# Patient Record
Sex: Female | Born: 1990 | Hispanic: No | Smoking: Current some day smoker
Health system: Southern US, Community
[De-identification: ages and names within clinical notes are randomized; demographics above are authoritative.]

## PROBLEM LIST (undated history)

## (undated) DIAGNOSIS — E039 Hypothyroidism, unspecified: Secondary | ICD-10-CM

## (undated) DIAGNOSIS — M8580 Other specified disorders of bone density and structure, unspecified site: Secondary | ICD-10-CM

## (undated) DIAGNOSIS — E2839 Other primary ovarian failure: Secondary | ICD-10-CM

## (undated) DIAGNOSIS — F419 Anxiety disorder, unspecified: Secondary | ICD-10-CM

## (undated) DIAGNOSIS — E288 Other ovarian dysfunction: Secondary | ICD-10-CM

## (undated) DIAGNOSIS — F319 Bipolar disorder, unspecified: Secondary | ICD-10-CM

## (undated) HISTORY — PX: DENTAL SURGERY: SHX609

---

## 2015-07-05 ENCOUNTER — Encounter (HOSPITAL_COMMUNITY): Payer: Self-pay

## 2015-07-05 ENCOUNTER — Emergency Department (HOSPITAL_COMMUNITY)
Admission: EM | Admit: 2015-07-05 | Discharge: 2015-07-05 | Disposition: A | Discharge status: Home / Self Care | Attending: Emergency Medicine | Admitting: Emergency Medicine

## 2015-07-05 DIAGNOSIS — Z79899 Other long term (current) drug therapy: Secondary | ICD-10-CM | POA: Insufficient documentation

## 2015-07-05 DIAGNOSIS — E039 Hypothyroidism, unspecified: Secondary | ICD-10-CM | POA: Diagnosis not present

## 2015-07-05 DIAGNOSIS — M8580 Other specified disorders of bone density and structure, unspecified site: Secondary | ICD-10-CM | POA: Insufficient documentation

## 2015-07-05 DIAGNOSIS — Z3202 Encounter for pregnancy test, result negative: Secondary | ICD-10-CM | POA: Insufficient documentation

## 2015-07-05 DIAGNOSIS — F32A Depression, unspecified: Secondary | ICD-10-CM

## 2015-07-05 DIAGNOSIS — F329 Major depressive disorder, single episode, unspecified: Secondary | ICD-10-CM | POA: Insufficient documentation

## 2015-07-05 DIAGNOSIS — N390 Urinary tract infection, site not specified: Secondary | ICD-10-CM | POA: Diagnosis not present

## 2015-07-05 DIAGNOSIS — Z87891 Personal history of nicotine dependence: Secondary | ICD-10-CM | POA: Diagnosis not present

## 2015-07-05 HISTORY — DX: Other primary ovarian failure: E28.39

## 2015-07-05 HISTORY — DX: Other specified disorders of bone density and structure, unspecified site: M85.80

## 2015-07-05 HISTORY — DX: Hypothyroidism, unspecified: E03.9

## 2015-07-05 HISTORY — DX: Other ovarian dysfunction: E28.8

## 2015-07-05 LAB — CBC
HCT: 40.8 % (ref 36.0–46.0)
Hemoglobin: 13.9 g/dL (ref 12.0–15.0)
MCH: 30.3 pg (ref 26.0–34.0)
MCHC: 34.1 g/dL (ref 30.0–36.0)
MCV: 88.9 fL (ref 78.0–100.0)
PLATELETS: 373 10*3/uL (ref 150–400)
RBC: 4.59 MIL/uL (ref 3.87–5.11)
RDW: 12.4 % (ref 11.5–15.5)
WBC: 7.9 10*3/uL (ref 4.0–10.5)

## 2015-07-05 LAB — COMPREHENSIVE METABOLIC PANEL
ALBUMIN: 4.6 g/dL (ref 3.5–5.0)
ALT: 13 U/L — ABNORMAL LOW (ref 14–54)
ANION GAP: 12 (ref 5–15)
AST: 18 U/L (ref 15–41)
Alkaline Phosphatase: 53 U/L (ref 38–126)
BILIRUBIN TOTAL: 1.1 mg/dL (ref 0.3–1.2)
BUN: 9 mg/dL (ref 6–20)
CHLORIDE: 104 mmol/L (ref 101–111)
CO2: 24 mmol/L (ref 22–32)
Calcium: 9.6 mg/dL (ref 8.9–10.3)
Creatinine, Ser: 0.81 mg/dL (ref 0.44–1.00)
GFR calc Af Amer: >60 mL/min (ref >60)
GLUCOSE: 104 mg/dL — AB (ref 65–99)
POTASSIUM: 3.6 mmol/L (ref 3.5–5.1)
Sodium: 140 mmol/L (ref 135–145)
TOTAL PROTEIN: 7.7 g/dL (ref 6.5–8.1)

## 2015-07-05 LAB — I-STAT BETA HCG BLOOD, ED (MC, WL, AP ONLY): I-stat hCG, quantitative: <5 m[IU]/mL (ref <5)

## 2015-07-05 LAB — ETHANOL

## 2015-07-05 LAB — URINALYSIS, ROUTINE W REFLEX MICROSCOPIC
Bilirubin Urine: NEGATIVE
Glucose, UA: NEGATIVE mg/dL
Ketones, ur: NEGATIVE mg/dL
Nitrite: POSITIVE — AB
Protein, ur: NEGATIVE mg/dL
Specific Gravity, Urine: 1.012 (ref 1.005–1.030)
pH: 5.5 (ref 5.0–8.0)

## 2015-07-05 LAB — URINE MICROSCOPIC-ADD ON

## 2015-07-05 LAB — RAPID URINE DRUG SCREEN, HOSP PERFORMED
AMPHETAMINES: NOT DETECTED
BENZODIAZEPINES: NOT DETECTED
Barbiturates: NOT DETECTED
COCAINE: NOT DETECTED
OPIATES: NOT DETECTED
Tetrahydrocannabinol: NOT DETECTED

## 2015-07-05 LAB — SALICYLATE LEVEL: Salicylate Lvl: <4 mg/dL (ref 2.8–30.0)

## 2015-07-05 LAB — ACETAMINOPHEN LEVEL

## 2015-07-05 MED ORDER — SULFAMETHOXAZOLE-TRIMETHOPRIM 800-160 MG PO TABS: 1.0000 | ORAL_TABLET | Freq: Two times a day (BID) | ORAL | Status: AC

## 2015-07-05 MED ORDER — SULFAMETHOXAZOLE-TRIMETHOPRIM 800-160 MG PO TABS
1.0000 | ORAL_TABLET | Freq: Two times a day (BID) | ORAL | Status: DC
  Administered 2015-07-05: 1 via ORAL
  Filled 2015-07-05: qty 1

## 2015-07-05 NOTE — ED Notes (Signed)
Dr Juleen China here to see

## 2015-07-05 NOTE — ED Notes (Signed)
Visitor in w/ pt 
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
## 2015-07-05 NOTE — ED Notes (Signed)
Discharge orders received. Pt agreeable to discharge. AVS and prescriptions provided and reviewed. Pt verbalizes understanding of discharge instructions. Pt offers no questions or concerns. Pt. Denies SI, HI, pain or discomfort. Pt. Belongings returned and signed for. Pt escorted to lobby to meet family.

## 2015-07-05 NOTE — ED Notes (Signed)
Pt c/o increasing depression x "a while," SI w/ plan to OD on ETOH or "any sort of medication that I can find" x 3 weeks ago, and dysuria x 2 days.  Pt reports recent divorce (December) and move w/o support system.  Denies HI/AV.  Denies substance abuse.  Pt reports "I haven't had my hormones for 2 weeks."  Pt reports early menopause.

## 2015-07-05 NOTE — ED Provider Notes (Signed)
CSN: [id number]     Arrival date & time 07/05/15  1134 History   First MD Initiated Contact with Patient 07/05/15 1349     Chief Complaint  Patient presents with  . Depression  . Suicidal     (Consider location/radiation/quality/duration/timing/severity/associated sxs/prior Treatment) HPI   25 year old female with increasing depression. Patient recently relocated to this area from [place]. Marital and fertility issues resulting in divorce last year. Increasingly depressed. Thoughts of "what if it all just ended" and she died. Several weeks ago thought about OD on medications and alcohol. Has had persistent feeling like this since then. Lives with sister. Previously seen a therapist when going through divorce but no established local mental health care.   Past Medical History  Diagnosis Date  . Premature ovarian failure   . Hypothyroidism   . Osteopenia    Past Surgical History  Procedure Laterality Date  . Dental surgery     History reviewed. No pertinent family history. Social History  Substance Use Topics  . Smoking status: Former Games developer  . Smokeless tobacco: None  . Alcohol Use: Yes   OB History    No data available     Review of Systems  All systems reviewed and negative, other than as noted in HPI.   Allergies  Strawberry extract  Home Medications   Prior to Admission medications   Medication Sig Start Date End Date Taking? Authorizing Provider  albuterol (PROVENTIL HFA;VENTOLIN HFA) 108 (90 Base) MCG/ACT inhaler Inhale 1-2 puffs into the lungs every 6 (six) hours as needed for wheezing or shortness of breath.   Yes Historical Provider, MD  calcium gluconate 500 MG tablet Take 1 tablet by mouth 2 (two) times daily.   Yes Historical Provider, MD  conjugated estrogens-medroxyprogesteron (PREMPHASE) TABS tablet Take 1 tablet by mouth daily.   Yes Historical Provider, MD  levothyroxine (SYNTHROID, LEVOTHROID) 50 MCG tablet Take 50 mcg by mouth daily before breakfast.    Yes Historical Provider, MD  Phenazopyridine HCl (AZO TABS PO) Take 2 tablets by mouth 2 (two) times daily.   Yes Historical Provider, MD   BP 127/90 mmHg  Pulse 72  Temp(Src) 98.2 F (36.8 C) (Oral)  Resp 17  SpO2 95%  LMP 06/30/2015 Physical Exam  Constitutional: She is oriented to person, place, and time. She appears well-developed and well-nourished. No distress.  HENT:  Head: Normocephalic and atraumatic.  Eyes: Conjunctivae are normal. Right eye exhibits no discharge. Left eye exhibits no discharge.  Neck: Neck supple.  Cardiovascular: Normal rate, regular rhythm and normal heart sounds.  Exam reveals no gallop and no friction rub.   No murmur heard. Pulmonary/Chest: Effort normal and breath sounds normal. No respiratory distress.  Abdominal: Soft. She exhibits no distension. There is no tenderness.  Musculoskeletal: She exhibits no edema or tenderness.  Neurological: She is alert and oriented to person, place, and time.  Skin: Skin is warm and dry.  Psychiatric: Her behavior is normal. Thought content normal.  Speech clear. Content appropriate. Thought process logical. Getting tearful at times when talking. Good insight.   Nursing note and vitals reviewed.   ED Course  Procedures (including critical care time) Labs Review Labs Reviewed  COMPREHENSIVE METABOLIC PANEL - Abnormal; Notable for the following:    Glucose, Bld 104 (*)    ALT 13 (*)    All other components within normal limits  ACETAMINOPHEN LEVEL - Abnormal; Notable for the following:    Acetaminophen (Tylenol), Serum <10 (*)    All  other components within normal limits  URINALYSIS, ROUTINE W REFLEX MICROSCOPIC (NOT AT [facility]) - Abnormal; Notable for the following:    Color, Urine ORANGE (*)    APPearance CLOUDY (*)    Hgb urine dipstick TRACE (*)    Nitrite POSITIVE (*)    Leukocytes, UA LARGE (*)    All other components within normal limits  URINE MICROSCOPIC-ADD ON - Abnormal; Notable for the  following:    Squamous Epithelial / LPF 0-5 (*)    Bacteria, UA FEW (*)    All other components within normal limits  URINE CULTURE  ETHANOL  SALICYLATE LEVEL  CBC  URINE RAPID DRUG SCREEN, HOSP PERFORMED  I-STAT BETA HCG BLOOD, ED ([facility], [facility], [facility] ONLY)    Imaging Review No results found. I have personally reviewed and evaluated these images and lab results as part of my medical decision-making.   EKG Interpretation None      MDM   Final diagnoses:  Depression  UTI (lower urinary tract infection)     24y female with increasing depression. Suicidal thoughts without a clear plan. Multiple recent stressors.  Did endorse UTI on ROS and UA is consistent with UTI. Abx.  Medically cleared. Needs continued abx but can appropriately be treated as an outpt for this.     Raeford Razor, MD 07/05/15 [phone]

## 2015-07-05 NOTE — ED Provider Notes (Signed)
Pt was assessed by [facility].  She is stable for close outpatient follow up.  Linwood Dibbles, MD 07/05/15 2106

## 2015-07-05 NOTE — ED Notes (Signed)
Pt has been changed into scrubs and wanded by Security.  

## 2015-07-05 NOTE — Discharge Instructions (Signed)

## 2015-07-05 NOTE — ED Notes (Signed)
Up to the bathroom 

## 2015-07-05 NOTE — ED Notes (Signed)
Pt is alert and oriented x 4. Endorses mild depression and anxiety. Pt denies SI,  HI VAH and pain. Pt continues to be calm and cooperative.

## 2015-07-07 LAB — URINE CULTURE: Culture: >=100000

## 2015-07-08 ENCOUNTER — Telehealth (HOSPITAL_BASED_OUTPATIENT_CLINIC_OR_DEPARTMENT_OTHER): Payer: Self-pay | Admitting: Emergency Medicine

## 2015-07-08 NOTE — Telephone Encounter (Signed)
Post ED Visit - Positive Culture Follow-up  Culture report reviewed by antimicrobial stewardship pharmacist:   Enzo Bi, Pharm.D.  Celedonio Miyamoto, Pharm.D., BCPS  Garvin Fila, Pharm.D.  Georgina Pillion, Pharm.D., BCPS  [place], [address].D., BCPS, AAHIVP  Estella Husk, Pharm.D., BCPS, AAHIVP  Tennis Must, Pharm.D.  Sherle Poe, [place].D.  Positive urine culture E. coli Treated with bactrim DS, organism sensitive to the same and no further patient follow-up is required at this time.  Berle Mull 07/08/2015, 11:21 AM

## 2015-11-01 ENCOUNTER — Emergency Department (HOSPITAL_COMMUNITY)
Admission: EM | Admit: 2015-11-01 | Discharge: 2015-11-02 | Disposition: A | Discharge status: Home / Self Care | Attending: Emergency Medicine | Admitting: Emergency Medicine

## 2015-11-01 ENCOUNTER — Emergency Department (HOSPITAL_COMMUNITY)

## 2015-11-01 DIAGNOSIS — Z87891 Personal history of nicotine dependence: Secondary | ICD-10-CM | POA: Insufficient documentation

## 2015-11-01 DIAGNOSIS — R05 Cough: Secondary | ICD-10-CM | POA: Diagnosis present

## 2015-11-01 DIAGNOSIS — M8580 Other specified disorders of bone density and structure, unspecified site: Secondary | ICD-10-CM | POA: Diagnosis not present

## 2015-11-01 DIAGNOSIS — E039 Hypothyroidism, unspecified: Secondary | ICD-10-CM | POA: Insufficient documentation

## 2015-11-01 DIAGNOSIS — J069 Acute upper respiratory infection, unspecified: Secondary | ICD-10-CM

## 2015-11-01 DIAGNOSIS — B9789 Other viral agents as the cause of diseases classified elsewhere: Secondary | ICD-10-CM

## 2015-11-01 DIAGNOSIS — Z79899 Other long term (current) drug therapy: Secondary | ICD-10-CM | POA: Diagnosis not present

## 2015-11-01 MED ORDER — ALBUTEROL SULFATE (2.5 MG/3ML) 0.083% IN NEBU
5.0000 mg | INHALATION_SOLUTION | Freq: Once | RESPIRATORY_TRACT | Status: AC
  Administered 2015-11-01: 5 mg via RESPIRATORY_TRACT
  Filled 2015-11-01: qty 6

## 2015-11-01 NOTE — ED Provider Notes (Signed)
CSN: [id number]     Arrival date & time 11/01/15  2133 History  By signing my name below, I, Placido SouLogan Joldersma, attest that this documentation has been prepared under the direction and in the presence of Felicie Mornavid Jovanie Verge, NP. Electronically Signed: Placido SouLogan Joldersma, ED Scribe. 11/01/2015. 11:05 PM.   Chief Complaint  Patient presents with  . Fever   Patient is a 25 y.o. female presenting with fever. The history is provided by the patient. No language interpreter was used.  Fever Max temp prior to arrival:  102.8 F Severity:  Moderate Onset quality:  Gradual Duration:  3 days Timing:  Constant Progression:  Waxing and waning Chronicity:  New Associated symptoms: congestion, cough and rhinorrhea   Associated symptoms: no nausea, no sore throat and no vomiting     HPI Comments: Darlene DuskyDesiree Simmons is a 25 y.o. female who presents to the Emergency Department complaining of waxing and waning, moderate, fever x 3 days (TMAX 102.8 F 3 days ago and 99.9 F in triage). She reports associated productive cough with yellow sputum and intermittent traces of blood as well as mild rhinorrhea, fatigue and congestion. Beginning today she began experiencing chest tightness, mild wheezing and mild SOB. She denies n/v, abd pain, changes in appetite and sore throat.   Past Medical History  Diagnosis Date  . Premature ovarian failure   . Hypothyroidism   . Osteopenia    Past Surgical History  Procedure Laterality Date  . Dental surgery     No family history on file. Social History  Substance Use Topics  . Smoking status: Former Games developermoker  . Smokeless tobacco: Not on file  . Alcohol Use: Yes   OB History    No data available     Review of Systems  Constitutional: Positive for fever and fatigue. Negative for appetite change.  HENT: Positive for congestion and rhinorrhea. Negative for sore throat.   Respiratory: Positive for cough, chest tightness, shortness of breath and wheezing.   Gastrointestinal: Negative  for nausea, vomiting and abdominal pain.  All other systems reviewed and are negative.   Allergies  Strawberry extract  Home Medications   Prior to Admission medications   Medication Sig Start Date End Date Taking? Authorizing Provider  albuterol (PROVENTIL HFA;VENTOLIN HFA) 108 (90 Base) MCG/ACT inhaler Inhale 1-2 puffs into the lungs every 6 (six) hours as needed for wheezing or shortness of breath.   Yes Historical Provider, MD  calcium gluconate 500 MG tablet Take 1 tablet by mouth 2 (two) times daily.   Yes Historical Provider, MD  conjugated estrogens-medroxyprogesteron (PREMPHASE) TABS tablet Take 1 tablet by mouth daily.   Yes Historical Provider, MD  levothyroxine (SYNTHROID, LEVOTHROID) 50 MCG tablet Take 50 mcg by mouth daily before breakfast.   Yes Historical Provider, MD  Phenazopyridine HCl (AZO TABS PO) Take 2 tablets by mouth 2 (two) times daily.   Yes Historical Provider, MD   BP 136/90 mmHg  Pulse 107  Temp(Src) 99.9 F (37.7 C) (Oral)  Resp 20  Ht 5' (1.524 m)  Wt 128 lb (58.06 kg)  BMI 25.00 kg/m2  SpO2 98%  LMP 10/30/2015    Physical Exam  Constitutional: She is oriented to person, place, and time. She appears well-developed and well-nourished.  HENT:  Head: Normocephalic and atraumatic.  Mouth/Throat: Uvula is midline. Posterior oropharyngeal erythema present.  Eyes: EOM are normal.  Neck: Normal range of motion.  Cardiovascular: Normal rate, regular rhythm and normal heart sounds.  Exam reveals no gallop and  no friction rub.   No murmur heard. Pulmonary/Chest: Effort normal and breath sounds normal. No respiratory distress. She has no wheezes. She has no rales.  Abdominal: Soft. There is no tenderness.  Musculoskeletal: Normal range of motion.  Neurological: She is alert and oriented to person, place, and time.  Skin: Skin is warm and dry.  Psychiatric: She has a normal mood and affect.  Nursing note and vitals reviewed.   ED Course  Procedures   DIAGNOSTIC STUDIES: Oxygen Saturation is 98% on RA, normal by my interpretation.    COORDINATION OF CARE: 11:00 PM Discussed next steps with pt. Pt verbalized understanding and is agreeable with the plan.   Labs Review Labs Reviewed  CBC WITH DIFFERENTIAL/PLATELET - Abnormal; Notable for the following:    WBC 12.0 (*)    Neutro Abs 9.1 (*)    All other components within normal limits    Imaging Review Dg Chest 2 View  11/01/2015  CLINICAL DATA:  Fever and shortness of breath for 3 days, initial encounter EXAM: CHEST  2 VIEW COMPARISON:  None. FINDINGS: The heart size and mediastinal contours are within normal limits. Both lungs are clear. The visualized skeletal structures are unremarkable. IMPRESSION: No active cardiopulmonary disease. Electronically Signed   By: Alcide Clever M.D.   On: 11/01/2015 22:20   I have personally reviewed and evaluated these images and lab results as part of my medical decision-making.   EKG Interpretation None      MDM   Final diagnoses:  None  Pt symptoms consistent with URI. CXR negative for acute infiltrate. Pt will be discharged with symptomatic treatment.  Discussed return precautions.  Pt is hemodynamically stable & in NAD prior to discharge.  I personally performed the services described in this documentation, which was scribed in my presence. The recorded information has been reviewed and is accurate.    Felicie Morn, NP 11/02/15 1610  Doug Sou, MD 11/02/15 [id number]

## 2015-11-01 NOTE — ED Notes (Signed)
Pt reports she has had a fever that started on Monday and the highest was 102.8 on Tuesday. At triage her temp is 99.9 and has not taken any meds tonight. She has a productive cough of dark yellow phlegm and her voice sounds like she is congested.

## 2015-11-02 LAB — CBC WITH DIFFERENTIAL/PLATELET
BASOS ABS: 0 10*3/uL (ref 0.0–0.1)
BASOS PCT: 0 %
EOS ABS: 0.2 10*3/uL (ref 0.0–0.7)
EOS PCT: 2 %
HCT: 36.3 % (ref 36.0–46.0)
Hemoglobin: 12.1 g/dL (ref 12.0–15.0)
LYMPHS PCT: 16 %
Lymphs Abs: 1.9 10*3/uL (ref 0.7–4.0)
MCH: 29.4 pg (ref 26.0–34.0)
MCHC: 33.3 g/dL (ref 30.0–36.0)
MCV: 88.1 fL (ref 78.0–100.0)
MONO ABS: 0.8 10*3/uL (ref 0.1–1.0)
Monocytes Relative: 7 %
Neutro Abs: 9.1 10*3/uL — ABNORMAL HIGH (ref 1.7–7.7)
Neutrophils Relative %: 75 %
PLATELETS: 282 10*3/uL (ref 150–400)
RBC: 4.12 MIL/uL (ref 3.87–5.11)
RDW: 12.2 % (ref 11.5–15.5)
WBC: 12 10*3/uL — AB (ref 4.0–10.5)

## 2015-11-02 MED ORDER — BENZONATATE 100 MG PO CAPS: 100.0000 mg | ORAL_CAPSULE | Freq: Three times a day (TID) | ORAL | Status: AC

## 2015-11-02 NOTE — Discharge Instructions (Signed)

## 2015-12-09 ENCOUNTER — Emergency Department (HOSPITAL_COMMUNITY)
Admission: EM | Admit: 2015-12-09 | Discharge: 2015-12-09 | Disposition: A | Discharge status: Home / Self Care | Attending: Emergency Medicine | Admitting: Emergency Medicine

## 2015-12-09 ENCOUNTER — Emergency Department (HOSPITAL_COMMUNITY)

## 2015-12-09 ENCOUNTER — Encounter (HOSPITAL_COMMUNITY): Payer: Self-pay

## 2015-12-09 DIAGNOSIS — E039 Hypothyroidism, unspecified: Secondary | ICD-10-CM | POA: Diagnosis not present

## 2015-12-09 DIAGNOSIS — Z87891 Personal history of nicotine dependence: Secondary | ICD-10-CM | POA: Insufficient documentation

## 2015-12-09 DIAGNOSIS — R0781 Pleurodynia: Secondary | ICD-10-CM | POA: Diagnosis present

## 2015-12-09 MED ORDER — KETOROLAC TROMETHAMINE 60 MG/2ML IM SOLN
60.0000 mg | Freq: Once | INTRAMUSCULAR | Status: AC
  Administered 2015-12-09: 60 mg via INTRAMUSCULAR
  Filled 2015-12-09: qty 2

## 2015-12-09 MED ORDER — OXYCODONE-ACETAMINOPHEN 5-325 MG PO TABS
1.0000 | ORAL_TABLET | Freq: Once | ORAL | Status: AC
  Administered 2015-12-09: 1 via ORAL
  Filled 2015-12-09: qty 1

## 2015-12-09 MED ORDER — NAPROXEN 500 MG PO TABS: 500.0000 mg | ORAL_TABLET | Freq: Two times a day (BID) | ORAL | Status: AC

## 2015-12-09 MED ORDER — LIDOCAINE 5 % EX PTCH: 1.0000 | MEDICATED_PATCH | CUTANEOUS | Status: AC

## 2015-12-09 MED ORDER — OXYCODONE-ACETAMINOPHEN 5-325 MG PO TABS: 1.0000 | ORAL_TABLET | Freq: Four times a day (QID) | ORAL | Status: AC | PRN

## 2015-12-09 MED ORDER — METHOCARBAMOL 500 MG PO TABS: 500.0000 mg | ORAL_TABLET | Freq: Two times a day (BID) | ORAL | Status: AC
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
## 2015-12-09 NOTE — ED Provider Notes (Signed)
CSN: [id number]     Arrival date & time 12/09/15  1820 History  By signing my name below, I, Rosario AdieWilliam Andrew Hiatt, attest that this documentation has been prepared under the direction and in the presence of Jakyle Petrucelli, PA-C.  Electronically Signed: Rosario AdieWilliam Andrew Hiatt, ED Scribe. 12/09/2015. 7:19 PM.   Chief Complaint  Patient presents with  . Ribcage pain    The history is provided by the patient. No language interpreter was used.    HPI Comments: Darlene DuskyDesiree Renn is a 25 y.o. female with a PMHx of osteopenia and Hypothyroidism who presents to the Emergency Department complaining of sudden onset, gradually worsening, constant, 10/10 right-sided ribcage pain s/p being tackled 4 days PTA. She has associated upper, right sided back pain. Per pt, she was playing football at her work when she was tackled landing on her back on the grassy field. She states that when she fell onto her back her pain was initially very mild, but has gradually worsened over the past few days. She also notes that two days ago she was laying in bed, turned abruptly on her side, and at that time she heard a "crack," accompanied with a significant amount of pain. Her pain is exacerbated with movement and deep breaths. No noted OTC medications or home remedies tried PTA. She denies abdominal pain, shortness of breath, N/V, fever/chills, or any other symptoms.     Past Medical History  Diagnosis Date  . Premature ovarian failure   . Hypothyroidism   . Osteopenia    Past Surgical History  Procedure Laterality Date  . Dental surgery     History reviewed. No pertinent family history. Social History  Substance Use Topics  . Smoking status: Former Games developermoker  . Smokeless tobacco: None  . Alcohol Use: Yes   OB History    No data available     Review of Systems  Constitutional: Negative for fever and chills.  Respiratory: Negative for cough and shortness of breath.   Gastrointestinal: Negative for nausea, vomiting and  abdominal pain.  Musculoskeletal: Positive for myalgias (ribcage) and back pain (upper right).  All other systems reviewed and are negative.  Allergies  Strawberry extract  Home Medications   Prior to Admission medications   Medication Sig Start Date End Date Taking? Authorizing Provider  albuterol (PROVENTIL HFA;VENTOLIN HFA) 108 (90 Base) MCG/ACT inhaler Inhale 1-2 puffs into the lungs every 6 (six) hours as needed for wheezing or shortness of breath.    Historical Provider, MD  benzonatate (TESSALON) 100 MG capsule Take 1 capsule (100 mg total) by mouth every 8 (eight) hours. 11/02/15   Felicie Mornavid Smith, NP  calcium gluconate 500 MG tablet Take 1 tablet by mouth 2 (two) times daily.    Historical Provider, MD  conjugated estrogens-medroxyprogesteron (PREMPHASE) TABS tablet Take 1 tablet by mouth daily.    Historical Provider, MD  levothyroxine (SYNTHROID, LEVOTHROID) 50 MCG tablet Take 50 mcg by mouth daily before breakfast.    Historical Provider, MD  lidocaine (LIDODERM) 5 % Place 1 patch onto the skin daily. Remove & Discard patch within 12 hours or as directed by MD 12/09/15   Anselm PancoastShawn C Braidon Chermak, PA-C  methocarbamol (ROBAXIN) 500 MG tablet Take 1 tablet (500 mg total) by mouth 2 (two) times daily. 12/09/15   Charli Liberatore C Mary Hockey, PA-C  naproxen (NAPROSYN) 500 MG tablet Take 1 tablet (500 mg total) by mouth 2 (two) times daily. 12/09/15   Gyneth Hubka C Tayonna Bacha, PA-C  oxyCODONE-acetaminophen (PERCOCET/ROXICET) 5-325 MG tablet  Take 1 tablet by mouth every 6 (six) hours as needed for severe pain. 12/09/15   Gaius Ishaq C Mac Dowdell, PA-C  Phenazopyridine HCl (AZO TABS PO) Take 2 tablets by mouth 2 (two) times daily.    Historical Provider, MD   BP 137/89 mmHg  Pulse 83  Temp(Src) 98 F (36.7 C) (Oral)  Resp 17  SpO2 98%  LMP 12/07/2015   Physical Exam  Constitutional: She is oriented to person, place, and time. She appears well-developed and well-nourished. No distress.  HENT:  Head: Normocephalic and atraumatic.  Eyes:  Conjunctivae are normal. Pupils are equal, round, and reactive to light.  Neck: Normal range of motion. Neck supple.  Cardiovascular: Normal rate, regular rhythm and intact distal pulses.   Pulmonary/Chest: Effort normal and breath sounds normal. No respiratory distress.  Abdominal: Soft. She exhibits no distension. There is no tenderness. There is no guarding.  Musculoskeletal: Normal range of motion. She exhibits tenderness. She exhibits no edema.  TTP to the anterior ribs 2, 3, and 6-8. No instability, swelling or crepitus noted. TTP over the right scapula.  Full ROM in all extremities and spine. No paraspinal tenderness.   Neurological: She is alert and oriented to person, place, and time.  No sensory deficits. Strength 5/5 in all extremities. No gait disturbance. Coordination intact.   Skin: Skin is warm and dry. She is not diaphoretic.  Psychiatric: She has a normal mood and affect. Her behavior is normal.  Nursing note and vitals reviewed.  ED Course  Procedures (including critical care time)  DIAGNOSTIC STUDIES: Oxygen Saturation is 98% on RA, normal by my interpretation.   COORDINATION OF CARE: 7:19 PM-Discussed next steps with pt including DG right scapula, DG T-spine, and DG unilateral ribs. Pt verbalized understanding and is agreeable with the plan.   Imaging Review Dg Ribs Unilateral W/chest Right  12/09/2015  CLINICAL DATA:  Pain after trauma. EXAM: RIGHT RIBS AND CHEST - 3+ VIEW COMPARISON:  Chest x-ray November 01, 2015 FINDINGS: The heart, hila, mediastinum, lungs, and pleura are normal. No pneumothorax. IMPRESSION: Negative. Electronically Signed   By: Gerome Sam III M.D   On: 12/09/2015 20:14   Dg Thoracic Spine 2 View  12/09/2015  CLINICAL DATA:  Pain after trauma EXAM: THORACIC SPINE 2 VIEWS COMPARISON:  None. FINDINGS: There is no evidence of thoracic spine fracture. Alignment is normal. No other significant bone abnormalities are identified. IMPRESSION: Negative.  Electronically Signed   By: Gerome Sam III M.D   On: 12/09/2015 20:16   Dg Scapula Right  12/09/2015  CLINICAL DATA:  Pain after trauma EXAM: RIGHT SCAPULA - 2+ VIEWS COMPARISON:  None. FINDINGS: There is no evidence of fracture or other focal bone lesions. Soft tissues are unremarkable. IMPRESSION: Negative. Electronically Signed   By: Gerome Sam III M.D   On: 12/09/2015 20:15    I have personally reviewed and evaluated these images as part of my medical decision-making.  MDM   Final diagnoses:  Rib pain on right side    Oneita Allmon presents with right rib and right upper back pain following an injury 4 days ago.  No abnormalities on x-ray. Supportive care indicated. Patient should be cleared by a physician prior to returning to work as her job is rather strenuous and her pain hinders her from lifting heavy objects. The patient was given instructions for home care as well as return precautions. Patient voices understanding of these instructions, accepts the plan, and is comfortable with discharge.  I personally performed the services described in this documentation, which was scribed in my presence. The recorded information has been reviewed and is accurate.   Anselm Pancoast, PA-C 12/09/15 2049  Tilden Fossa, MD 12/10/15 1450

## 2015-12-09 NOTE — Discharge Instructions (Signed)
You have been seen today for rib and back pain. Your imaging showed no abnormalities. Take it easy, but do not lay around too much as this may make the stiffness worse. Take 500 mg of naproxen every 12 hours or 800 mg of ibuprofen every 8 hours for the next 3 days. Take these medications with food to avoid upset stomach. Robaxin is a muscle relaxer and may help loosen stiff muscles. Percocet for severe pain. Do not take the Robaxin or Percocet while driving or performing other dangerous activities. Follow up with PCP as needed should symptoms fail to resolve. Return to ED should symptoms worsen.

## 2015-12-09 NOTE — ED Notes (Signed)
PT DISCHARGED. INSTRUCTIONS AND PRESCRIPTIONS GIVEN. AAOX4. PT IN NO APPARENT DISTRESS. THE OPPORTUNITY TO ASK QUESTIONS WAS PROVIDED. 

## 2015-12-09 NOTE — ED Notes (Signed)
Patient transported to X-ray 

## 2016-03-06 ENCOUNTER — Other Ambulatory Visit: Payer: Self-pay | Admitting: Nurse Practitioner

## 2016-03-06 ENCOUNTER — Ambulatory Visit
Admission: RE | Admit: 2016-03-06 | Discharge: 2016-03-06 | Disposition: A | Discharge status: Home / Self Care | Source: Ambulatory Visit | Attending: Nurse Practitioner | Admitting: Nurse Practitioner

## 2016-03-06 DIAGNOSIS — M79672 Pain in left foot: Secondary | ICD-10-CM

## 2016-08-29 ENCOUNTER — Encounter (HOSPITAL_COMMUNITY): Payer: Self-pay | Admitting: Emergency Medicine

## 2016-08-29 ENCOUNTER — Emergency Department (HOSPITAL_COMMUNITY)
Admission: EM | Admit: 2016-08-29 | Discharge: 2016-08-29 | Disposition: A | Discharge status: Home / Self Care | Attending: Emergency Medicine | Admitting: Emergency Medicine

## 2016-08-29 DIAGNOSIS — F17210 Nicotine dependence, cigarettes, uncomplicated: Secondary | ICD-10-CM | POA: Diagnosis not present

## 2016-08-29 DIAGNOSIS — N390 Urinary tract infection, site not specified: Secondary | ICD-10-CM | POA: Diagnosis not present

## 2016-08-29 DIAGNOSIS — E039 Hypothyroidism, unspecified: Secondary | ICD-10-CM | POA: Diagnosis not present

## 2016-08-29 DIAGNOSIS — Z79899 Other long term (current) drug therapy: Secondary | ICD-10-CM | POA: Insufficient documentation

## 2016-08-29 DIAGNOSIS — R300 Dysuria: Secondary | ICD-10-CM | POA: Diagnosis present

## 2016-08-29 LAB — URINALYSIS, ROUTINE W REFLEX MICROSCOPIC
Bilirubin Urine: NEGATIVE
GLUCOSE, UA: NEGATIVE mg/dL
KETONES UR: NEGATIVE mg/dL
NITRITE: NEGATIVE
PH: 6 (ref 5.0–8.0)
Protein, ur: 30 mg/dL — AB
Specific Gravity, Urine: 1.023 (ref 1.005–1.030)

## 2016-08-29 LAB — POC URINE PREG, ED: Preg Test, Ur: NEGATIVE

## 2016-08-29 MED ORDER — IBUPROFEN 800 MG PO TABS
800.0000 mg | ORAL_TABLET | Freq: Once | ORAL | Status: AC
  Administered 2016-08-29: 800 mg via ORAL
  Filled 2016-08-29: qty 1

## 2016-08-29 MED ORDER — CEPHALEXIN 500 MG PO CAPS
500.0000 mg | ORAL_CAPSULE | Freq: Three times a day (TID) | ORAL | 0 refills | Status: AC
Start: 2016-08-29 — End: 2016-09-08

## 2016-08-29 NOTE — ED Triage Notes (Signed)
Patient reports urinary frequency and dysuria starting last night. Patient also reports pelvic pain starting this morning. Hx of cyst rupturing.

## 2016-08-29 NOTE — ED Provider Notes (Signed)
[facility]-EMERGENCY DEPT Provider Note   CSN: [id number] Arrival date & time: 08/29/16  0801     History   Chief Complaint Chief Complaint  Patient presents with  . Dysuria  . Pelvic Pain    HPI Darlene Simmons is a 26 y.o. female.  The history is provided by the patient.  Dysuria   This is a new problem. The current episode started yesterday. The problem occurs every urination. The problem has not changed since onset.The quality of the pain is described as burning. The pain is moderate. There has been no fever. She is sexually active (new partner no protection). Associated symptoms include frequency. Pertinent negatives include no vomiting, no hematuria and no flank pain. She has tried nothing for the symptoms. Her past medical history is significant for recurrent UTIs.    Past Medical History:  Diagnosis Date  . Hypothyroidism   . Osteopenia   . Premature ovarian failure     There are no active problems to display for this patient.   Past Surgical History:  Procedure Laterality Date  . DENTAL SURGERY      OB History    No data available       Home Medications    Prior to Admission medications   Medication Sig Start Date End Date Taking? Authorizing Provider  albuterol (PROVENTIL HFA;VENTOLIN HFA) 108 (90 Base) MCG/ACT inhaler Inhale 1-2 puffs into the lungs every 6 (six) hours as needed for wheezing or shortness of breath.    Historical Provider, MD  benzonatate (TESSALON) 100 MG capsule Take 1 capsule (100 mg total) by mouth every 8 (eight) hours. 11/02/15   Felicie Morn, NP  calcium gluconate 500 MG tablet Take 1 tablet by mouth 2 (two) times daily.    Historical Provider, MD  conjugated estrogens-medroxyprogesteron (PREMPHASE) TABS tablet Take 1 tablet by mouth daily.    Historical Provider, MD  levothyroxine (SYNTHROID, LEVOTHROID) 50 MCG tablet Take 50 mcg by mouth daily before breakfast.    Historical Provider, MD  lidocaine (LIDODERM) 5 % Place 1 patch onto the  skin daily. Remove & Discard patch within 12 hours or as directed by MD 12/09/15   Anselm Pancoast, PA-C  methocarbamol (ROBAXIN) 500 MG tablet Take 1 tablet (500 mg total) by mouth 2 (two) times daily. 12/09/15   Shawn C Joy, PA-C  naproxen (NAPROSYN) 500 MG tablet Take 1 tablet (500 mg total) by mouth 2 (two) times daily. 12/09/15   Shawn C Joy, PA-C  oxyCODONE-acetaminophen (PERCOCET/ROXICET) 5-325 MG tablet Take 1 tablet by mouth every 6 (six) hours as needed for severe pain. 12/09/15   Shawn C Joy, PA-C  Phenazopyridine HCl (AZO TABS PO) Take 2 tablets by mouth 2 (two) times daily.    Historical Provider, MD    Family History No family history on file.  Social History Social History  Substance Use Topics  . Smoking status: Current Some Day Smoker    Types: Cigarettes  . Smokeless tobacco: Never Used  . Alcohol use Yes     Allergies   Strawberry extract   Review of Systems Review of Systems  Gastrointestinal: Negative for vomiting.  Genitourinary: Positive for dysuria and frequency. Negative for flank pain and hematuria.  All other systems reviewed and are negative.    Physical Exam Updated Vital Signs BP (!) 129/100 (BP Location: Right Arm)   Pulse 85   Temp 97.9 F (36.6 C) (Oral)   Resp 18   Ht 5' (1.524 m)   Wt  138 lb (62.6 kg)   LMP 07/23/2016   SpO2 97%   BMI 26.95 kg/m   Physical Exam  Constitutional: She is oriented to person, place, and time. She appears well-developed and well-nourished. No distress.  HENT:  Head: Normocephalic.  Nose: Nose normal.  Eyes: Conjunctivae are normal.  Neck: Neck supple. No tracheal deviation present.  Cardiovascular: Normal rate and regular rhythm.   Pulmonary/Chest: Effort normal. No respiratory distress.  Abdominal: Soft. She exhibits no distension. There is tenderness (mild suprapubic). There is no rebound and no guarding.  Neurological: She is alert and oriented to person, place, and time.  Skin: Skin is warm and dry.    Psychiatric: She has a normal mood and affect.     ED Treatments / Results  Labs (all labs ordered are listed, but only abnormal results are displayed) Labs Reviewed  URINALYSIS, ROUTINE W REFLEX MICROSCOPIC  POC URINE PREG, ED    EKG  EKG Interpretation None       Radiology No results found.  Procedures Procedures (including critical care time)  Medications Ordered in ED Medications - No data to display   Initial Impression / Assessment and Plan / ED Course  I have reviewed the triage vital signs and the nursing notes.  Pertinent labs & imaging results that were available during my care of the patient were reviewed by me and considered in my medical decision making (see chart for details).     26 year old female presents with typical urinary tract infection symptoms of dysuria and frequency starting yesterday but worsening this morning now having some suprapubic pain during her workout. She recently became sexually active with a new partner and does not use protection. No discharge, has scant vaginal bleeding. Given her typical symptoms will treat empirically with Keflex pending culture. Discussed need for STI check the symptoms do not improve or she develops vaginal discharge.  Final Clinical Impressions(s) / ED Diagnoses   Final diagnoses:  Lower urinary tract infectious disease    New Prescriptions New Prescriptions   CEPHALEXIN (KEFLEX) 500 MG CAPSULE    Take 1 capsule (500 mg total) by mouth 3 (three) times daily.     Lyndal Pulley, MD 08/29/16 [phone]

## 2016-08-30 LAB — URINE CULTURE: Culture: NO GROWTH

## 2017-06-25 IMAGING — CR DG FOOT 2V*L*
2 series · 2 of 2 positions shown · non-contrast
Comparison: None.

CLINICAL DATA: Left foot pain for 6 weeks.

EXAM:
LEFT FOOT - 2 VIEW

[x foot ap left]
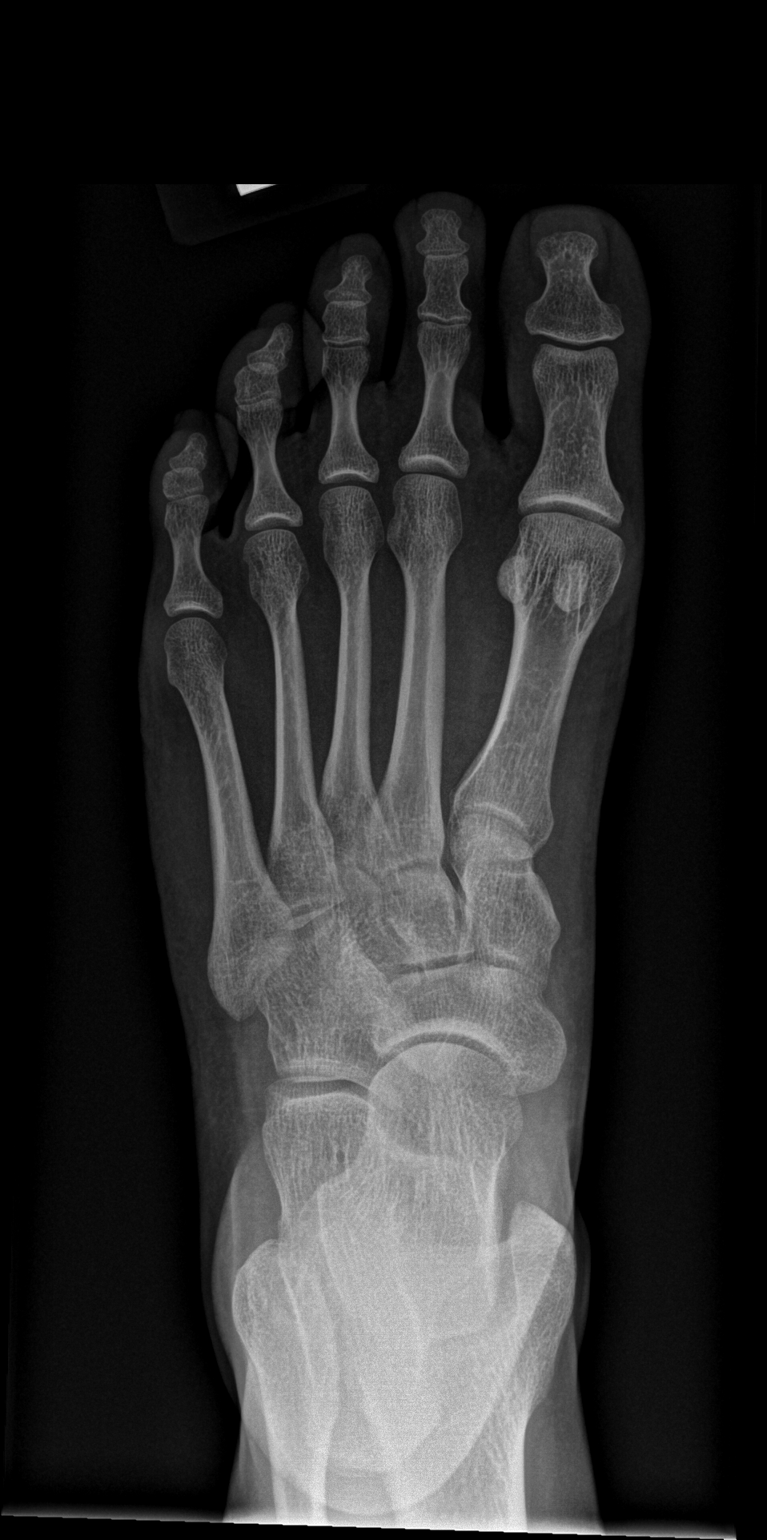

[x foot lat left]
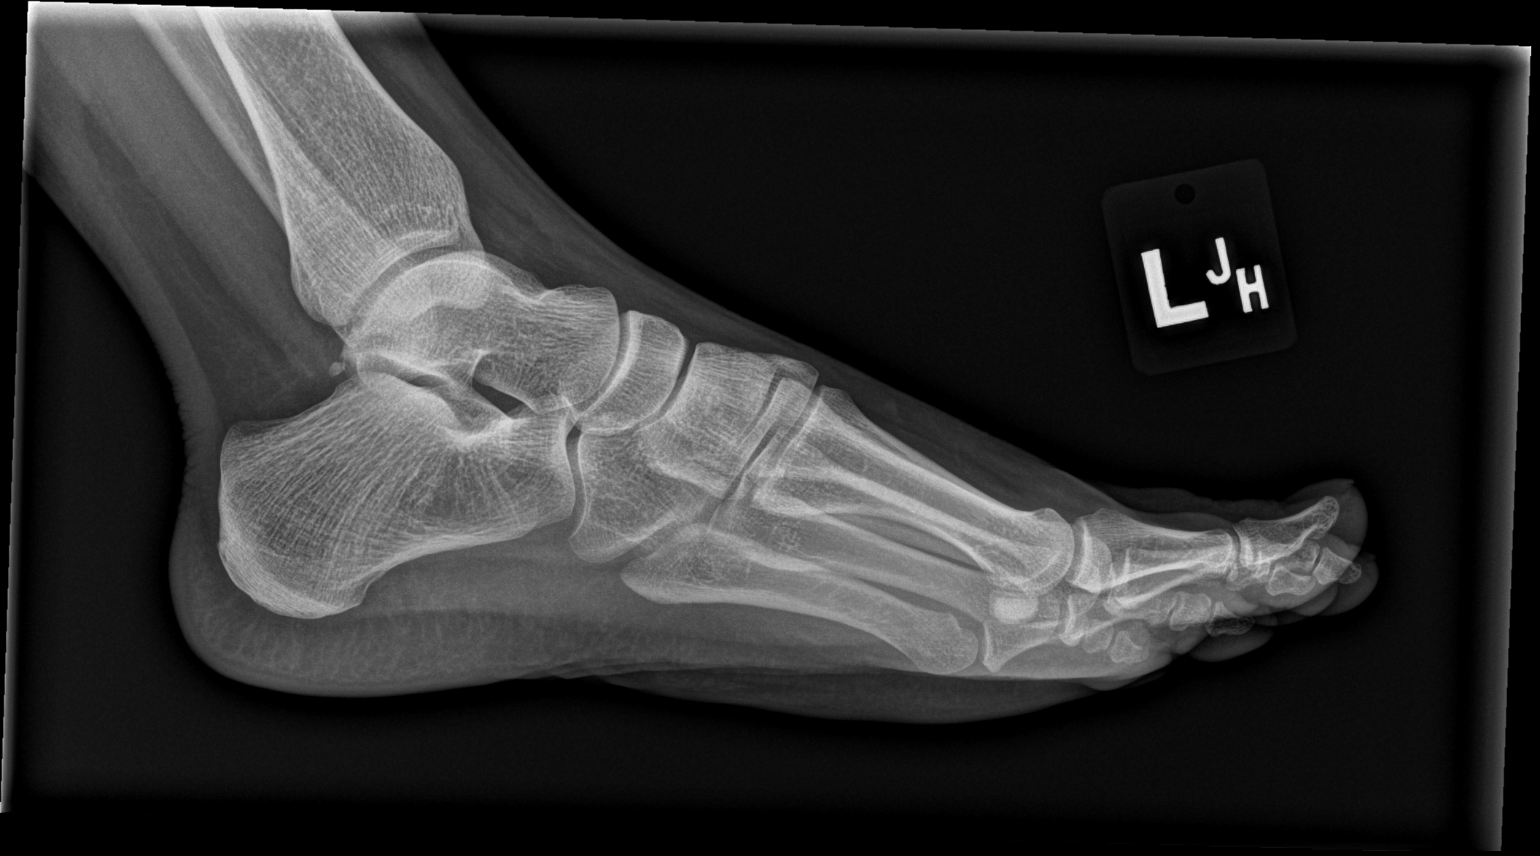

[2 of 2 positions shown; findings below may reference images not displayed]

FINDINGS: There is no evidence of fracture or dislocation. There is no
evidence of arthropathy or other focal bone abnormality. Soft
tissues are unremarkable.
IMPRESSION: Normal left foot.

## 2018-04-30 ENCOUNTER — Emergency Department (HOSPITAL_BASED_OUTPATIENT_CLINIC_OR_DEPARTMENT_OTHER)
Admission: EM | Admit: 2018-04-30 | Discharge: 2018-04-30 | Disposition: A | Discharge status: Home / Self Care | Attending: Emergency Medicine | Admitting: Emergency Medicine

## 2018-04-30 ENCOUNTER — Encounter (HOSPITAL_BASED_OUTPATIENT_CLINIC_OR_DEPARTMENT_OTHER): Payer: Self-pay

## 2018-04-30 DIAGNOSIS — Z5321 Procedure and treatment not carried out due to patient leaving prior to being seen by health care provider: Secondary | ICD-10-CM | POA: Insufficient documentation

## 2018-04-30 DIAGNOSIS — R0602 Shortness of breath: Secondary | ICD-10-CM | POA: Insufficient documentation

## 2018-04-30 HISTORY — DX: Bipolar disorder, unspecified: F31.9

## 2018-04-30 HISTORY — DX: Anxiety disorder, unspecified: F41.9

## 2018-04-30 NOTE — ED Triage Notes (Signed)
Pt c/o SOB when taking a deep breath [id number], worse today; no distress, speaking in complete sentences

## 2018-04-30 NOTE — ED Notes (Signed)
Pt sts she wants to leave because she doesn't want to wait any longer; requested a note that said she was here; explained to pt that she would need to be seen/discharged to receive a note.
